# Patient Record
Sex: Male | Born: 1995 | Race: Black or African American | Hispanic: No | Marital: Single | Smoking: Current every day smoker
Health system: Southern US, Community
[De-identification: ages and names within clinical notes are randomized; demographics above are authoritative.]

---
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
## 2019-12-27 ENCOUNTER — Encounter (HOSPITAL_COMMUNITY): Payer: Self-pay

## 2019-12-27 ENCOUNTER — Emergency Department (HOSPITAL_COMMUNITY)
Admission: EM | Admit: 2019-12-27 | Discharge: 2019-12-28 | Disposition: A | Discharge status: Home / Self Care | Payer: Medicaid Other | Attending: Emergency Medicine | Admitting: Emergency Medicine

## 2019-12-27 ENCOUNTER — Other Ambulatory Visit: Payer: Self-pay

## 2019-12-27 ENCOUNTER — Emergency Department (HOSPITAL_COMMUNITY): Payer: Medicaid Other

## 2019-12-27 DIAGNOSIS — Z20822 Contact with and (suspected) exposure to covid-19: Secondary | ICD-10-CM | POA: Insufficient documentation

## 2019-12-27 DIAGNOSIS — R112 Nausea with vomiting, unspecified: Secondary | ICD-10-CM | POA: Insufficient documentation

## 2019-12-27 DIAGNOSIS — R1013 Epigastric pain: Secondary | ICD-10-CM | POA: Insufficient documentation

## 2019-12-27 LAB — CBC
HCT: 46.3 % (ref 39.0–52.0)
Hemoglobin: 15 g/dL (ref 13.0–17.0)
MCH: 26.8 pg (ref 26.0–34.0)
MCHC: 32.4 g/dL (ref 30.0–36.0)
MCV: 82.7 fL (ref 80.0–100.0)
Platelets: 280 10*3/uL (ref 150–400)
RBC: 5.6 MIL/uL (ref 4.22–5.81)
RDW: 13.8 % (ref 11.5–15.5)
WBC: 9.1 10*3/uL (ref 4.0–10.5)
nRBC: 0 % (ref 0.0–0.2)

## 2019-12-27 LAB — BASIC METABOLIC PANEL
Anion gap: 11 (ref 5–15)
BUN: 14 mg/dL (ref 6–20)
CO2: 22 mmol/L (ref 22–32)
Calcium: 9.8 mg/dL (ref 8.9–10.3)
Chloride: 103 mmol/L (ref 98–111)
Creatinine, Ser: 1.38 mg/dL — ABNORMAL HIGH (ref 0.61–1.24)
GFR calc Af Amer: >60 mL/min (ref >60)
GFR calc non Af Amer: >60 mL/min (ref >60)
Glucose, Bld: 112 mg/dL — ABNORMAL HIGH (ref 70–99)
Potassium: 4 mmol/L (ref 3.5–5.1)
Sodium: 136 mmol/L (ref 135–145)

## 2019-12-27 LAB — TROPONIN I (HIGH SENSITIVITY): Troponin I (High Sensitivity): 4 ng/L (ref <18)

## 2019-12-27 NOTE — ED Triage Notes (Signed)
Patient complains of cp with inspiration since am. Denies injury, no cold symptoms. NAD

## 2019-12-28 LAB — HEPATIC FUNCTION PANEL
ALT: 29 U/L (ref 0–44)
AST: 38 U/L (ref 15–41)
Albumin: 4.4 g/dL (ref 3.5–5.0)
Alkaline Phosphatase: 76 U/L (ref 38–126)
Bilirubin, Direct: 0.3 mg/dL — ABNORMAL HIGH (ref 0.0–0.2)
Indirect Bilirubin: 0.5 mg/dL (ref 0.3–0.9)
Total Bilirubin: 0.8 mg/dL (ref 0.3–1.2)
Total Protein: 7.7 g/dL (ref 6.5–8.1)

## 2019-12-28 LAB — RESPIRATORY PANEL BY RT PCR (FLU A&B, COVID)
Influenza A by PCR: NEGATIVE
Influenza B by PCR: NEGATIVE
SARS Coronavirus 2 by RT PCR: NEGATIVE

## 2019-12-28 LAB — LIPASE, BLOOD: Lipase: 24 U/L (ref 11–51)

## 2019-12-28 MED ORDER — LIDOCAINE VISCOUS HCL 2 % MT SOLN
15.0000 mL | Freq: Once | OROMUCOSAL | Status: AC
  Administered 2019-12-28: 15 mL via ORAL
  Filled 2019-12-28: qty 15

## 2019-12-28 MED ORDER — ONDANSETRON 4 MG PO TBDP: 4.0000 mg | ORAL_TABLET | Freq: Three times a day (TID) | ORAL | 0 refills | Status: DC | PRN

## 2019-12-28 MED ORDER — ALUM & MAG HYDROXIDE-SIMETH 200-200-20 MG/5ML PO SUSP
30.0000 mL | Freq: Once | ORAL | Status: AC
  Administered 2019-12-28: 30 mL via ORAL
  Filled 2019-12-28: qty 30

## 2019-12-28 MED ORDER — ONDANSETRON 4 MG PO TBDP
4.0000 mg | ORAL_TABLET | Freq: Once | ORAL | Status: AC
  Administered 2019-12-28: 4 mg via ORAL
  Filled 2019-12-28: qty 1

## 2019-12-28 NOTE — Discharge Instructions (Signed)
Thank you for allowing me to care for you today in the Emergency Department.   You have been seen in the Emergency Department (ED) for abdominal pain.  Your evaluation did not identify a clear cause of your symptoms, but was generally reassuring.  Because of this, you can follow-up with primary care for further work-up of your symptoms.  Call the number on your discharge paperwork to get established with a primary care provider for follow-up regarding today's emergent visit and the symptoms that are bothering you.  Let 1 tablet of Zofran dissolve under your tongue if you develop nausea or vomiting.  You can also try taking Pepcid if your pain returns, which is available over-the-counter.  Since you did have vomiting today, make sure that you are drinking plenty of fluids over the next few days.  Your COVID-19 test is pending.  You can download MyChart on your smart phone to view the results once they are available.  You should also receive a call from the hospital if the test is positive.  If it is positive, you will need to quarantine at home for a total of 14 days.  Return to the ED if your abdominal pain worsens or fails to improve, you develop bloody vomiting, bloody diarrhea, you are unable to tolerate fluids due to vomiting, if you stop making urine, severe abdominal pain with fevers, or other new, concerning symptoms

## 2019-12-28 NOTE — ED Provider Notes (Signed)
[facility] EMERGENCY DEPARTMENT Provider Note   CSN: [id number] Arrival date & time: 12/27/19  1218     History No chief complaint on file.   Tony Kennedy is a 24 y.o. male with no chronic medical conditions who presents to the emergency department with a chief complaint of chest pain.  The patient reports that at approximately 11 AM that he developed a sudden onset, constant epigastric pain that radiates into the center of his chest.  Pain does not radiate into his back.  Pain was 9 out of 10 at onset and is currently 8 out of 10.  Pain is pleuritic.  He does note that the pain began approximately 45 minutes after eating an omelette.  Reports this is the third episode of similar pain and has been occurring 1x weekly over the last 3 weeks.  He notes that all of the episodes of pain began around the same time of day.  Reports that he was feeling at his baseline prior to sudden onset of pain, but has been feeling more short of breath since pain began.  He has had approximately 7-8 episodes of nonbloody, nonbilious vomiting and nausea.  He has not attempted to eat or drink since symptoms began.  He denies cough, URI symptoms, back pain, urinary complaints, penile or testicular pain or swelling, constipation, diarrhea, leg swelling.  However, despite vomiting, patient voided 3 times over the last 12 hours and denies decreased urine output.  He attempted to treat his symptoms with heartburn medication with no improvement.  He has not had increased burping or belching.  His significant other is concerned that he may have an ulcer.  He does report that as a child he suffered from chronic constipation and required hospitalization and an NG tube for fecal impaction.  No history of abdominal surgery.  He is vaccinated against COVID-19.  He reports social alcohol use.  He smokes marijuana approximately 10 times per month.  He last smoked 2 days ago.  He denies any other illicit or  recreational substance use.  He has not been taking any NSAIDs.  No new recent medications.  No personal or familial history of VTE.  No long travel or recent immobilization.  The history is provided by the patient and medical records. No language interpreter was used.       History reviewed. No pertinent past medical history.  There are no problems to display for this patient.   History reviewed. No pertinent surgical history.     No family history on file.  Social History   Tobacco Use  . Smoking status: Not on file  Substance Use Topics  . Alcohol use: Not on file  . Drug use: Not on file    Home Medications Prior to Admission medications   Medication Sig Start Date End Date Taking? Authorizing Provider  ondansetron (ZOFRAN ODT) 4 MG disintegrating tablet Take 1 tablet (4 mg total) by mouth every 8 (eight) hours as needed. 12/28/19   Karston Hyland A, PA-C    Allergies    Patient has no known allergies.  Review of Systems   Review of Systems  Constitutional: Negative for appetite change, chills and fever.  HENT: Negative for congestion and sore throat.   Eyes: Negative for visual disturbance.  Respiratory: Negative for cough, choking, chest tightness, shortness of breath and wheezing.   Cardiovascular: Positive for chest pain. Negative for palpitations and leg swelling.  Gastrointestinal: Positive for abdominal pain, nausea and vomiting.  Negative for anal bleeding, blood in stool, constipation, diarrhea and rectal pain.  Genitourinary: Negative for discharge, dysuria, flank pain, frequency, hematuria, penile pain, penile swelling, scrotal swelling, testicular pain and urgency.  Musculoskeletal: Negative for back pain, myalgias and neck stiffness.  Skin: Negative for rash.  Allergic/Immunologic: Negative for immunocompromised state.  Neurological: Negative for dizziness, seizures, syncope, weakness, numbness and headaches.  Psychiatric/Behavioral: Negative for  confusion.   Physical Exam Updated Vital Signs BP 131/76 (BP Location: Left Arm)   Pulse 82   Temp 99.3 F (37.4 C) (Oral)   Resp 16   Ht 5\' 10"  (1.778 m)   SpO2 100%   Physical Exam Vitals and nursing note reviewed.  Constitutional:      General: He is not in acute distress.    Appearance: He is well-developed. He is not ill-appearing, toxic-appearing or diaphoretic.     Comments: Well-appearing.  No acute distress.  HENT:     Head: Normocephalic.     Mouth/Throat:     Mouth: Mucous membranes are moist.     Pharynx: No oropharyngeal exudate or posterior oropharyngeal erythema.  Eyes:     Conjunctiva/sclera: Conjunctivae normal.  Cardiovascular:     Rate and Rhythm: Normal rate and regular rhythm.     Pulses: Normal pulses.     Heart sounds: Normal heart sounds. No murmur heard.  No friction rub. No gallop.   Pulmonary:     Effort: Pulmonary effort is normal. No respiratory distress.     Breath sounds: No stridor. No wheezing, rhonchi or rales.     Comments: Lungs are clear to auscultation bilaterally.  No increased work of breathing.  No tenderness palpation to the chest wall.  No crepitus or step-offs. Chest:     Chest wall: No tenderness.  Abdominal:     General: There is no distension.     Palpations: Abdomen is soft. There is no mass.     Tenderness: There is no abdominal tenderness. There is no right CVA tenderness, left CVA tenderness, guarding or rebound.     Hernia: No hernia is present.     Comments: Abdomen is soft, nondistended.  No tenderness.  Negative Murphy sign.  No CVA tenderness.  No tenderness over McBurney's point.  Negative Rovsing sign.  Normoactive bowel sounds.  Musculoskeletal:     Cervical back: Neck supple.     Right lower leg: No edema.     Left lower leg: No edema.  Skin:    General: Skin is warm and dry.     Capillary Refill: Capillary refill takes less than 2 seconds.     Coloration: Skin is not jaundiced or pale.  Neurological:      Mental Status: He is alert.  Psychiatric:        Behavior: Behavior normal.     ED Results / Procedures / Treatments   Labs (all labs ordered are listed, but only abnormal results are displayed) Labs Reviewed  BASIC METABOLIC PANEL - Abnormal; Notable for the following components:      Result Value   Glucose, Bld 112 (*)    Creatinine, Ser 1.38 (*)    All other components within normal limits  HEPATIC FUNCTION PANEL - Abnormal; Notable for the following components:   Bilirubin, Direct 0.3 (*)    All other components within normal limits  RESPIRATORY PANEL BY RT PCR (FLU A&B, COVID)  CBC  LIPASE, BLOOD  TROPONIN I (HIGH SENSITIVITY)    EKG None  Radiology DG  Chest 2 View  Result Date: 12/27/2019 CLINICAL DATA:  Chest pain, shortness of breath EXAM: CHEST - 2 VIEW COMPARISON:  None. FINDINGS: The lungs are clear and negative for focal airspace consolidation, pulmonary edema or suspicious pulmonary nodule. No pleural effusion or pneumothorax. Cardiac and mediastinal contours are within normal limits. No acute fracture or lytic or blastic osseous lesions. The visualized upper abdominal bowel gas pattern is unremarkable. IMPRESSION: Normal chest x-ray. Electronically Signed   By: Malachy Moan M.D.   On: 12/27/2019 13:54    Procedures Procedures (including critical care time)  Medications Ordered in ED Medications  ondansetron (ZOFRAN-ODT) disintegrating tablet 4 mg (4 mg Oral Given 12/28/19 0038)  alum & mag hydroxide-simeth (MAALOX/MYLANTA) 200-200-20 MG/5ML suspension 30 mL (30 mLs Oral Given 12/28/19 0039)    And  lidocaine (XYLOCAINE) 2 % viscous mouth solution 15 mL (15 mLs Oral Given 12/28/19 0039)    ED Course  I have reviewed the triage vital signs and the nursing notes.  Pertinent labs & imaging results that were available during my care of the patient were reviewed by me and considered in my medical decision making (see chart for details).    MDM  Rules/Calculators/A&P                          24 year old male with no chronic medical conditions who presents to the emergency department with complaints of chest pain, nausea, and vomiting, onset at 11 AM.  Notably, patient reports similar episodes of previous pain, which all began around the same time of day, approximately once per week over the last few weeks.  However, previous episodes resolved spontaneously and he did not have any associated nausea or vomiting.   When asked to describe his symptoms, he points to his epigastric area and it states that the pain radiates superiorly in the center of his chest.  Pain is pleuritic.  Vital signs are normal.  Abdominal exam is benign.  He has no reproducible tenderness palpation throughout the exam.  Differential diagnosis includes atypical presentation of biliary colic, peptic ulcer disease, pancreatitis, PE, ACS, gastritis, cholecystitis, or GERD.  Could also consider biliary colic as he has had similar episodes of pain intermittently over the last few weeks and today's episode occurred shortly after eating an omelette.  Could also consider hyperemesis cannabinoid syndrome as patient does report marijuana use, last used 2 days ago.  Although he describes pleuritic pain, he is PERC negative.  He is satting at 100% on room air.  I have very low suspicion for PE.  He had an initial troponin that was normal.  EKG with normal sinus rhythm.  Chest x-ray has been reviewed by me and is unremarkable for acute cardiopulmonary findings.  Upper abdominal bowel gas pattern is also unremarkable.  Patient did report that he required an NG tube during a hospitalization in 2010 after he struggled with chronic constipation as a child.  Possibly bowel obstruction? No history of abdominal surgery.  He denies constipation has been passing flatus.  I have a very low suspicion for bowel obstruction.  Labs are are overall reassuring.   Single troponin was not elevated.  HEAR  score is one. His creatinine is 1.38.  No recent labs for comparison.  Although he may have a mild AKI from vomiting, patient was fluid challenged in the ER with no further episodes of vomiting.  He was also able to eat graham crackers.  He received a  GI cocktail and Zofran and on reevaluation, pain had resolved.  Repeat abdominal exam remains benign.  Clinically, he does not appear dehydrated with no tachycardia, good capillary refill, moist mucous membranes.  He has also had good urine output.  We will plan to discharge home with Zofran.  However, since symptoms did resolve after GI cocktail, there is suspicion for GERD.  Doubt peptic ulcer disease as he has no significant risk factors.  He did request a COVID-19 test, which has been ordered and is pending.  Encouraged the patient to get established with primary care for follow-up since he has had recurrent symptoms.  ER return precautions given.  He is hemodynamically stable and in no acute distress.  Safe for discharge to home with outpatient follow-up as indicated.  Final Clinical Impression(s) / ED Diagnoses Final diagnoses:  Epigastric abdominal pain  Non-intractable vomiting with nausea, unspecified vomiting type    Rx / DC Orders ED Discharge Orders         Ordered    ondansetron (ZOFRAN ODT) 4 MG disintegrating tablet  Every 8 hours PRN        12/28/19 0143           Kacey Dysert, Pedro EarlsMia A, PA-C 12/28/19 0205    Mesner, Barbara CowerJason, MD 12/28/19 [phone]

## 2020-03-06 ENCOUNTER — Ambulatory Visit: Payer: Medicaid Other | Admitting: Internal Medicine

## 2023-06-14 ENCOUNTER — Ambulatory Visit (HOSPITAL_COMMUNITY)
Admission: RE | Admit: 2023-06-14 | Discharge: 2023-06-14 | Disposition: A | Discharge status: Home / Self Care | Payer: Self-pay | Source: Ambulatory Visit | Attending: Physician Assistant | Admitting: Physician Assistant

## 2023-06-14 ENCOUNTER — Encounter (HOSPITAL_COMMUNITY): Payer: Self-pay

## 2023-06-14 VITALS — BP 123/81 | HR 99 | Temp 98.9°F | Resp 16 | Ht 67.0 in | Wt 242.0 lb

## 2023-06-14 DIAGNOSIS — Z113 Encounter for screening for infections with a predominantly sexual mode of transmission: Secondary | ICD-10-CM | POA: Insufficient documentation

## 2023-06-14 LAB — HIV ANTIBODY (ROUTINE TESTING W REFLEX): HIV Screen 4th Generation wRfx: NONREACTIVE

## 2023-06-14 NOTE — ED Triage Notes (Signed)
 Patient here today to be tested for all Stds.

## 2023-06-14 NOTE — Discharge Instructions (Signed)
 We will keep you updated on the results of your testing once the results are available.  If medication or treatment is indicated by those results that will be sent into the pharmacy that we have on file. Please make sure that you are practicing safe sex and using barrier methods to prevent exposure. It is recommended to avoid intercourse until you have the results back from testing and have completed any treatments that are sent in for you.

## 2023-06-14 NOTE — ED Provider Notes (Signed)
 [facility]    CSN: [id number] Arrival date & time: 06/14/23  1007      History   Chief Complaint Chief Complaint  Patient presents with   SEXUALLY TRANSMITTED DISEASE    Entered by patient    HPI Tony Kennedy is a 28 y.o. male.   HPI  He denies dysuria, penile pain or discharge He reports a sexual partner has tested positive for Trichomonas  He has not been using a condom with this partner   History reviewed. No pertinent past medical history.  There are no active problems to display for this patient.   History reviewed. No pertinent surgical history.     Home Medications    Prior to Admission medications   Not on File    Family History History reviewed. No pertinent family history.  Social History Social History   Tobacco Use   Smoking status: Every Day    Types: Cigars   Smokeless tobacco: Never  Vaping Use   Vaping status: Never Used  Substance Use Topics   Alcohol use: Yes    Comment: occasionally   Drug use: Yes    Types: Marijuana     Allergies   Patient has no known allergies.   Review of Systems Review of Systems  Constitutional:  Negative for chills and fever.  Genitourinary:  Negative for dysuria, genital sores, penile discharge, penile pain, penile swelling, scrotal swelling and testicular pain.     Physical Exam Triage Vital Signs ED Triage Vitals  Encounter Vitals Group     BP 06/14/23 1036 123/81     Systolic BP Percentile --      Diastolic BP Percentile --      Pulse Rate 06/14/23 1036 99     Resp 06/14/23 1036 16     Temp 06/14/23 1036 98.9 F (37.2 C)     Temp Source 06/14/23 1036 Oral     SpO2 06/14/23 1036 95 %     Weight 06/14/23 1037 242 lb (109.8 kg)     Height 06/14/23 1037 5\' 7"  (1.702 m)     Head Circumference --      Peak Flow --      Pain Score 06/14/23 1037 0     Pain Loc --      Pain Education --      Exclude from Growth Chart --    No data found.  Updated Vital Signs BP 123/81  (BP Location: Left Arm)   Pulse 99   Temp 98.9 F (37.2 C) (Oral)   Resp 16   Ht 5\' 7"  (1.702 m)   Wt 242 lb (109.8 kg)   SpO2 95%   BMI 37.90 kg/m   Visual Acuity Right Eye Distance:   Left Eye Distance:   Bilateral Distance:    Right Eye Near:   Left Eye Near:    Bilateral Near:     Physical Exam Vitals reviewed.  Constitutional:      General: He is awake.     Appearance: Normal appearance. He is well-developed and well-groomed.  HENT:     Head: Normocephalic and atraumatic.  Eyes:     Extraocular Movements: Extraocular movements intact.     Conjunctiva/sclera: Conjunctivae normal.  Pulmonary:     Effort: Pulmonary effort is normal.  Musculoskeletal:     Cervical back: Normal range of motion.  Neurological:     General: No focal deficit present.     Mental Status: He is alert and oriented  to person, place, and time.     GCS: GCS eye subscore is 4. GCS verbal subscore is 5. GCS motor subscore is 6.  Psychiatric:        Attention and Perception: Attention normal.        Mood and Affect: Mood normal.        Speech: Speech normal.        Behavior: Behavior normal. Behavior is cooperative.      UC Treatments / Results  Labs (all labs ordered are listed, but only abnormal results are displayed) Labs Reviewed  RPR  HIV ANTIBODY (ROUTINE TESTING W REFLEX)  CYTOLOGY, (ORAL, ANAL, URETHRAL) ANCILLARY ONLY    EKG   Radiology No results found.  Procedures Procedures (including critical care time)  Medications Ordered in UC Medications - No data to display  Initial Impression / Assessment and Plan / UC Course  I have reviewed the triage vital signs and the nursing notes.  Pertinent labs & imaging results that were available during my care of the patient were reviewed by me and considered in my medical decision making (see chart for details).      Final Clinical Impressions(s) / UC Diagnoses   Final diagnoses:  Screening examination for STD (sexually  transmitted disease)   Patient presents today for STD testing.  He reports that a partner that he is sexually active with recently has tested positive for trichomonas.  He denies current symptoms today.  Cytology to test for gonorrhea, chlamydia, trichomonas completed as well as HIV and RPR testing.  Patient was informed that he will be updated on the results of this testing and any medications indicated by those results will be sent into the pharmacy on file.  Recommend refraining from sexual activity until testing results are negative or you have completed an appropriate medication regimen.  Recommend using a condom or another barrier method to prevent STD transmission.  Follow-up as needed for progressing or persistent symptoms.    Discharge Instructions      We will keep you updated on the results of your testing once the results are available.  If medication or treatment is indicated by those results that will be sent into the pharmacy that we have on file. Please make sure that you are practicing safe sex and using barrier methods to prevent exposure. It is recommended to avoid intercourse until you have the results back from testing and have completed any treatments that are sent in for you.       ED Prescriptions   None    PDMP not reviewed this encounter.   Trenese Haft, Oswaldo Conroy, PA-C 06/14/23 1125

## 2023-06-15 LAB — CYTOLOGY, (ORAL, ANAL, URETHRAL) ANCILLARY ONLY
Chlamydia: NEGATIVE
Comment: NEGATIVE
Comment: NEGATIVE
Comment: NORMAL
Neisseria Gonorrhea: NEGATIVE
Trichomonas: POSITIVE — AB

## 2023-06-15 LAB — RPR: RPR Ser Ql: NONREACTIVE

## 2023-06-15 NOTE — Progress Notes (Signed)
 Syphilis and HIV testing are negative.  No changes recommended to treatment regimen at this time.

## 2023-06-16 ENCOUNTER — Telehealth (HOSPITAL_COMMUNITY): Payer: Self-pay

## 2023-06-16 MED ORDER — METRONIDAZOLE 500 MG PO TABS: 2000.0000 mg | ORAL_TABLET | Freq: Once | ORAL | 0 refills | Status: AC

## 2023-06-16 NOTE — Telephone Encounter (Signed)
 Per protocol, pt requires tx with metronidazole. Rx sent to pharmacy on file.

## 2023-08-13 ENCOUNTER — Ambulatory Visit (HOSPITAL_COMMUNITY)

## 2023-08-25 ENCOUNTER — Ambulatory Visit (HOSPITAL_COMMUNITY)

## 2023-08-27 ENCOUNTER — Encounter (HOSPITAL_COMMUNITY): Payer: Self-pay

## 2023-08-27 ENCOUNTER — Ambulatory Visit (HOSPITAL_COMMUNITY)
Admission: RE | Admit: 2023-08-27 | Discharge: 2023-08-27 | Disposition: A | Discharge status: Home / Self Care | Source: Ambulatory Visit | Attending: Emergency Medicine | Admitting: Emergency Medicine

## 2023-08-27 VITALS — BP 121/70 | HR 88 | Temp 98.7°F | Resp 18

## 2023-08-27 DIAGNOSIS — J3489 Other specified disorders of nose and nasal sinuses: Secondary | ICD-10-CM | POA: Diagnosis not present

## 2023-08-27 DIAGNOSIS — R0981 Nasal congestion: Secondary | ICD-10-CM | POA: Diagnosis not present

## 2023-08-27 DIAGNOSIS — H66002 Acute suppurative otitis media without spontaneous rupture of ear drum, left ear: Secondary | ICD-10-CM | POA: Diagnosis not present

## 2023-08-27 MED ORDER — IPRATROPIUM BROMIDE 0.06 % NA SOLN: 2.0000 | Freq: Three times a day (TID) | NASAL | 1 refills | Status: AC

## 2023-08-27 MED ORDER — AMOXICILLIN-POT CLAVULANATE 875-125 MG PO TABS: 1.0000 | ORAL_TABLET | Freq: Two times a day (BID) | ORAL | 0 refills | Status: AC

## 2023-08-27 NOTE — Discharge Instructions (Addendum)
 To treat the bacterial infection in your left inner ear, also called otitis media, please begin Augmentin .  Please take 1 tablet twice daily for the next 7 days.  Even if you are feeling better, please be sure that you finish all the tablets exactly as prescribed to make sure the infection is complete resolved and to make sure does not come back [place].  For management of nasal congestion and runny nose, which may have contributed to your developing this ear infection, please begin Atrovent  nasal spray.  Please spray 2 sprays into each nare 2-3 times daily.  This will dry out mucus production and reduce inflammation in your nose that may cause you to feel congested.  If you are not feeling better in the next 5 to 7 days, please return for repeat evaluation or follow-up with your primary care provider.  Thank you for visiting [facility] today.

## 2023-08-27 NOTE — ED Provider Notes (Signed)
 [facility]    CSN: [id number] Arrival date & time: 08/27/23  1338    HISTORY   Chief Complaint  Patient presents with   Ear Fullness    Entered by patient   HPI Tony Kennedy is a pleasant, 28 y.o. male who presents to urgent care today. Patient complains of a 2-day history of his left ear feeling muffled as if there is water in his ear.  Patient also reports diminished hearing in his left ear as well.  Patient denies history of frequent ear infections, sinus infections, fever, body aches, chills, sore throat, headache, drainage from his left ear.  The history is provided by the patient.  Ear Fullness   History reviewed. No pertinent past medical history. There are no active problems to display for this patient.  History reviewed. No pertinent surgical history.  Home Medications    Prior to Admission medications   Medication Sig Start Date End Date Taking? Authorizing Provider  amoxicillin-clavulanate (AUGMENTIN) 875-125 MG tablet Take 1 tablet by mouth 2 (two) times daily for 7 days. 08/27/23 09/03/23 Yes Eloise Hake Scales, PA-C  ipratropium (ATROVENT) 0.06 % nasal spray Place 2 sprays into both nostrils 3 (three) times daily. As needed for nasal congestion, runny nose 08/27/23  Yes Eloise Hake Scales, PA-C    Family History History reviewed. No pertinent family history. Social History Social History   Tobacco Use   Smoking status: Every Day    Types: Cigars   Smokeless tobacco: Never  Vaping Use   Vaping status: Never Used  Substance Use Topics   Alcohol use: Yes    Comment: occasionally   Drug use: Yes    Types: Marijuana   Allergies   Patient has no known allergies.  Review of Systems Review of Systems Pertinent findings revealed after performing a 14 point review of systems has been noted in the history of present illness.  Physical Exam Vital Signs BP 121/70 (BP Location: Left Arm)   Pulse 88   Temp 98.7 F (37.1 C) (Oral)    Resp 18   SpO2 96%   No data found.  Physical Exam Vitals and nursing note reviewed.  Constitutional:      General: He is awake. He is not in acute distress.    Appearance: Normal appearance. He is not ill-appearing.  HENT:     Head: Normocephalic and atraumatic.     Salivary Glands: Right salivary gland is not diffusely enlarged or tender. Left salivary gland is not diffusely enlarged or tender.     Right Ear: Hearing, tympanic membrane, ear canal and external ear normal. No drainage. No middle ear effusion. There is no impacted cerumen. Tympanic membrane is not erythematous or bulging.     Left Ear: Ear canal and external ear normal. Decreased hearing noted. No drainage. A middle ear effusion (Purulent) is present. There is no impacted cerumen. Tympanic membrane is erythematous. Tympanic membrane is not injected, scarred, perforated or bulging.     Nose: Congestion and rhinorrhea present. No nasal deformity, septal deviation or mucosal edema. Rhinorrhea is clear.     Right Turbinates: Not enlarged, swollen or pale.     Left Turbinates: Not enlarged, swollen or pale.     Right Sinus: No maxillary sinus tenderness or frontal sinus tenderness.     Left Sinus: No maxillary sinus tenderness or frontal sinus tenderness.     Mouth/Throat:     Lips: Pink. No lesions.     Mouth: Mucous membranes are  moist. No oral lesions.     Pharynx: Oropharynx is clear. Uvula midline. No posterior oropharyngeal erythema or uvula swelling.     Tonsils: No tonsillar exudate. 0 on the right. 0 on the left.  Eyes:     General: Lids are normal.        Right eye: No discharge.        Left eye: No discharge.     Extraocular Movements: Extraocular movements intact.     Conjunctiva/sclera: Conjunctivae normal.     Right eye: Right conjunctiva is not injected.     Left eye: Left conjunctiva is not injected.  Neck:     Trachea: Trachea and phonation normal.  Cardiovascular:     Rate and Rhythm: Normal rate and  regular rhythm.     Pulses: Normal pulses.     Heart sounds: Normal heart sounds. No murmur heard.    No friction rub. No gallop.  Pulmonary:     Effort: Pulmonary effort is normal. No accessory muscle usage, prolonged expiration or respiratory distress.     Breath sounds: Normal breath sounds. No stridor, decreased air movement or transmitted upper airway sounds. No decreased breath sounds, wheezing, rhonchi or rales.  Chest:     Chest wall: No tenderness.  Musculoskeletal:        General: Normal range of motion.     Cervical back: Normal range of motion and neck supple. Normal range of motion.  Lymphadenopathy:     Cervical: No cervical adenopathy.  Skin:    General: Skin is warm and dry.     Findings: No erythema or rash.  Neurological:     General: No focal deficit present.     Mental Status: He is alert and oriented to person, place, and time.  Psychiatric:        Mood and Affect: Mood normal.        Behavior: Behavior normal. Behavior is cooperative.     Visual Acuity Right Eye Distance:   Left Eye Distance:   Bilateral Distance:    Right Eye Near:   Left Eye Near:    Bilateral Near:     UC Couse / Diagnostics / Procedures:     Radiology No results found.  Procedures Procedures (including critical care time) EKG  Pending results:  Labs Reviewed - No data to display  Medications Ordered in UC: Medications - No data to display  UC Diagnoses / Final Clinical Impressions(s)   I have reviewed the triage vital signs and the nursing notes.  Pertinent labs & imaging results that were available during my care of the patient were reviewed by me and considered in my medical decision making (see chart for details).    Final diagnoses:  Acute suppurative otitis media of left ear without spontaneous rupture of tympanic membrane, recurrence not specified  Nasal congestion  Rhinorrhea   Patient provided with a prescription for Augmentin x 7 days for treatment of  acute suppurative otitis media.  Patient provided with Atrovent nasal spray for rhinorrhea nasal congestion.  Please see discharge instructions below for details of plan of care as provided to patient. ED Prescriptions     Medication Sig Dispense Auth. Provider   amoxicillin-clavulanate (AUGMENTIN) 875-125 MG tablet Take 1 tablet by mouth 2 (two) times daily for 7 days. 14 tablet Eloise Hake Scales, PA-C   ipratropium (ATROVENT) 0.06 % nasal spray Place 2 sprays into both nostrils 3 (three) times daily. As needed for nasal congestion, runny nose 15  mL Eloise Hake Scales, PA-C      PDMP not reviewed this encounter.  Pending results:  Labs Reviewed - No data to display    Discharge Instructions      To treat the bacterial infection in your left inner ear, also called otitis media, please begin Augmentin .  Please take 1 tablet twice daily for the next 7 days.  Even if you are feeling better, please be sure that you finish all the tablets exactly as prescribed to make sure the infection is complete resolved and to make sure does not come back [place].  For management of nasal congestion and runny nose, which may have contributed to your developing this ear infection, please begin Atrovent  nasal spray.  Please spray 2 sprays into each nare 2-3 times daily.  This will dry out mucus production and reduce inflammation in your nose that may cause you to feel congested.  If you are not feeling better in the next 5 to 7 days, please return for repeat evaluation or follow-up with your primary care provider.  Thank you for visiting [facility] today.   Disposition Upon Discharge:  Condition: stable for discharge home  Patient presented with an acute illness with associated systemic symptoms and significant discomfort requiring urgent management. In my opinion, this is a condition that a prudent lay person (someone who possesses an average knowledge of health and medicine) may  potentially expect to result in complications if not addressed urgently such as respiratory distress, impairment of bodily function or dysfunction of bodily organs.   Routine symptom specific, illness specific and/or disease specific instructions were discussed with the patient and/or caregiver at length.   As such, the patient has been evaluated and assessed, work-up was performed and treatment was provided in alignment with urgent care protocols and evidence based medicine.  Patient/parent/caregiver has been advised that the patient may require follow up for further testing and treatment if the symptoms continue in spite of treatment, as clinically indicated and appropriate.  Patient/parent/caregiver has been advised to return to the [facility] or PCP if no better; to PCP or the Emergency Department if new signs and symptoms develop, or if the current signs or symptoms continue to change or worsen for further workup, evaluation and treatment as clinically indicated and appropriate  The patient will follow up with their current PCP if and as advised. If the patient does not currently have a PCP we will assist them in obtaining one.   The patient may need specialty follow up if the symptoms continue, in spite of conservative treatment and management, for further workup, evaluation, consultation and treatment as clinically indicated and appropriate.  Patient/parent/caregiver verbalized understanding and agreement of plan as discussed.  All questions were addressed during visit.  Please see discharge instructions below for further details of plan.  This office note has been dictated using Teaching laboratory technician.  Unfortunately, this method of dictation can sometimes lead to typographical or grammatical errors.  I apologize for your inconvenience in advance if this occurs.  Please do not hesitate to reach out to me if clarification is needed.      Eloise Hake Scales, PA-C 08/27/23 1455

## 2023-08-27 NOTE — ED Triage Notes (Signed)
 Pt states his left ear feels muffled with water x 2 days
